# Patient Record
Sex: Female | Born: 1975 | Race: White | Hispanic: No | Marital: Single | Smoking: Smoker, current status unknown
Health system: Southern US, Community
[De-identification: ages and names within clinical notes are randomized; demographics above are authoritative.]

## PROBLEM LIST (undated history)

## (undated) HISTORY — PX: TONSILLECTOMY: SUR1361

---

## 1999-05-31 ENCOUNTER — Other Ambulatory Visit: Admission: RE | Admit: 1999-05-31 | Discharge: 1999-05-31 | Payer: Self-pay | Admitting: Obstetrics & Gynecology

## 1999-10-26 ENCOUNTER — Other Ambulatory Visit: Admission: RE | Admit: 1999-10-26 | Discharge: 1999-10-26 | Payer: Self-pay | Admitting: Obstetrics & Gynecology

## 2000-01-16 ENCOUNTER — Inpatient Hospital Stay (HOSPITAL_COMMUNITY): Admission: AD | Admit: 2000-01-16 | Discharge: 2000-01-18 | Payer: Self-pay | Admitting: Obstetrics & Gynecology

## 2000-02-15 ENCOUNTER — Other Ambulatory Visit: Admission: RE | Admit: 2000-02-15 | Discharge: 2000-02-15 | Payer: Self-pay | Admitting: Obstetrics and Gynecology

## 2000-02-24 ENCOUNTER — Other Ambulatory Visit: Admission: RE | Admit: 2000-02-24 | Discharge: 2000-02-24 | Payer: Self-pay | Admitting: Obstetrics and Gynecology

## 2000-08-03 ENCOUNTER — Other Ambulatory Visit: Admission: RE | Admit: 2000-08-03 | Discharge: 2000-08-03 | Payer: Self-pay | Admitting: Obstetrics and Gynecology

## 2001-08-16 ENCOUNTER — Other Ambulatory Visit: Admission: RE | Admit: 2001-08-16 | Discharge: 2001-08-16 | Payer: Self-pay | Admitting: Obstetrics and Gynecology

## 2003-06-17 ENCOUNTER — Other Ambulatory Visit: Admission: RE | Admit: 2003-06-17 | Discharge: 2003-06-17 | Payer: Self-pay | Admitting: Obstetrics and Gynecology

## 2003-12-05 ENCOUNTER — Inpatient Hospital Stay (HOSPITAL_COMMUNITY): Admission: AD | Admit: 2003-12-05 | Discharge: 2003-12-05 | Payer: Self-pay | Admitting: Obstetrics and Gynecology

## 2003-12-24 ENCOUNTER — Inpatient Hospital Stay (HOSPITAL_COMMUNITY): Admission: AD | Admit: 2003-12-24 | Discharge: 2003-12-26 | Payer: Self-pay | Admitting: Obstetrics and Gynecology

## 2005-06-08 ENCOUNTER — Other Ambulatory Visit: Admission: RE | Admit: 2005-06-08 | Discharge: 2005-06-08 | Payer: Self-pay | Admitting: Obstetrics and Gynecology

## 2005-08-08 IMAGING — US US FETAL BPP W/O NONSTRESS
1 series · 14 of 28 positions shown · non-contrast
Comparison: none

CLINICAL DATA: 28-year-old 42 weeks pregnant with fetal decelerations in the office.

[Series 1: unknown · 0.37mm/px · 14 of 34 slices shown]
[im 2/34]
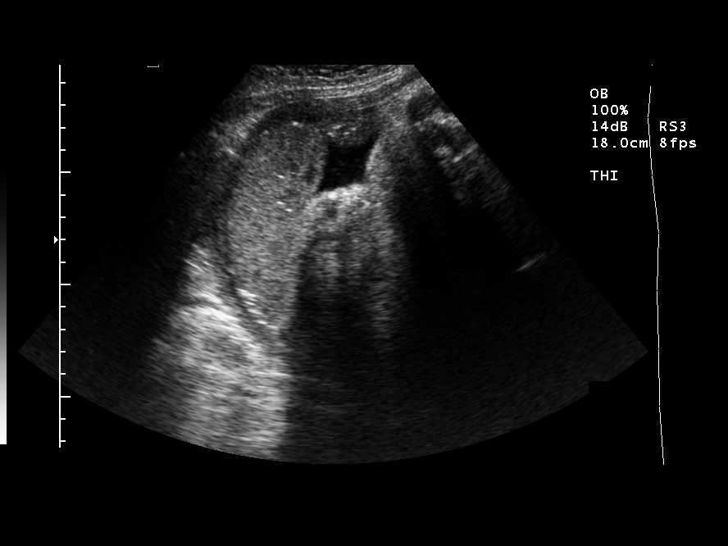
[im 4/34]
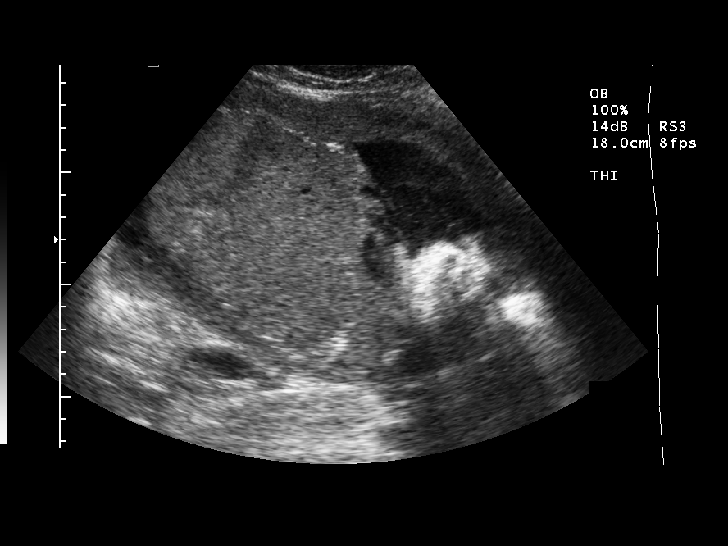
[im 7/34]
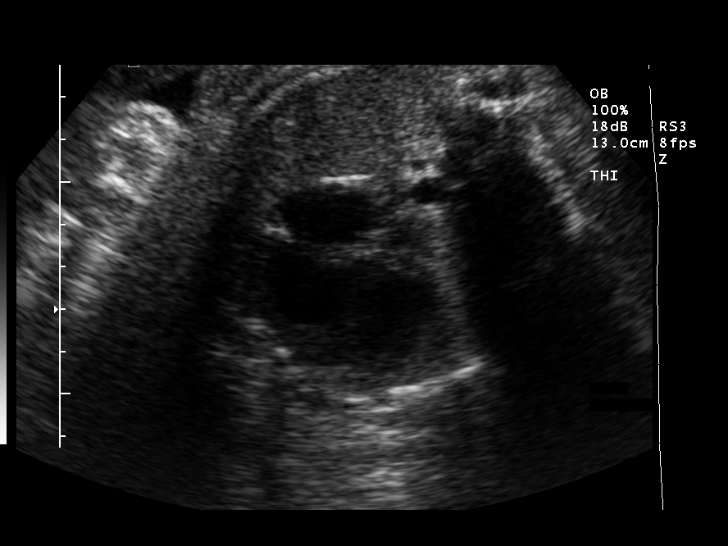
[im 9/34]
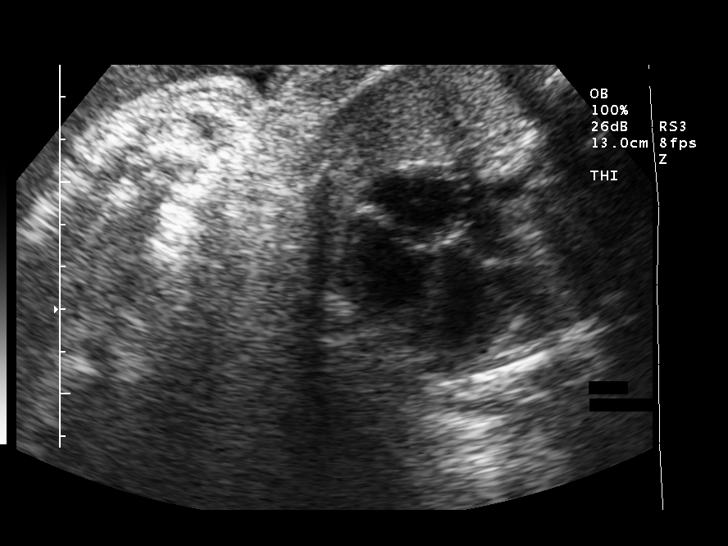
[im 12/34]
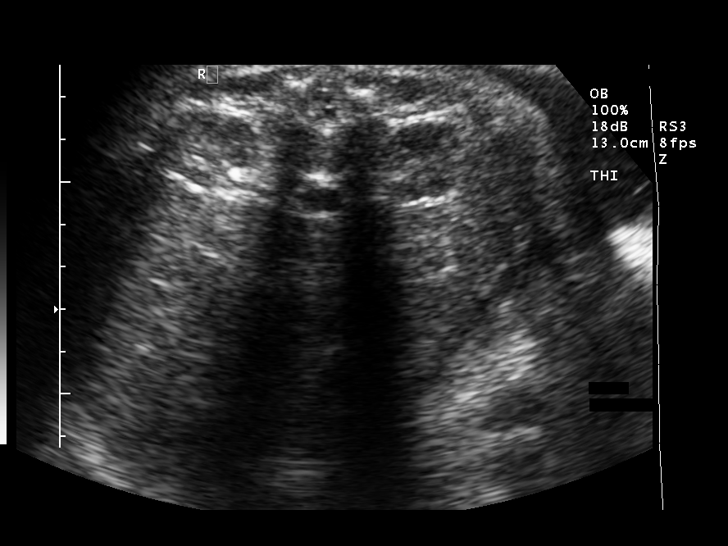
[im 14/34]
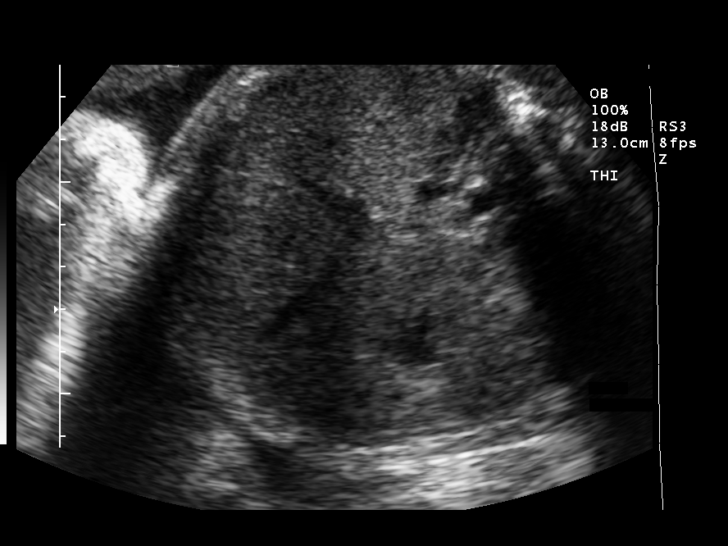
[im 16/34]
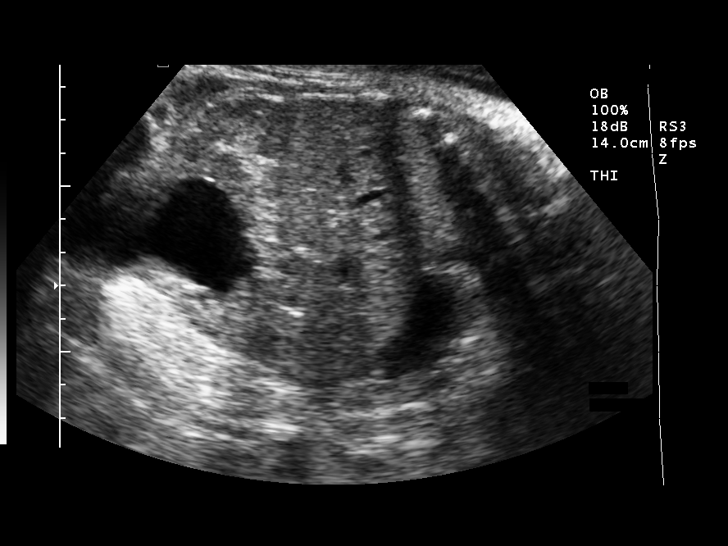
[im 19/34]
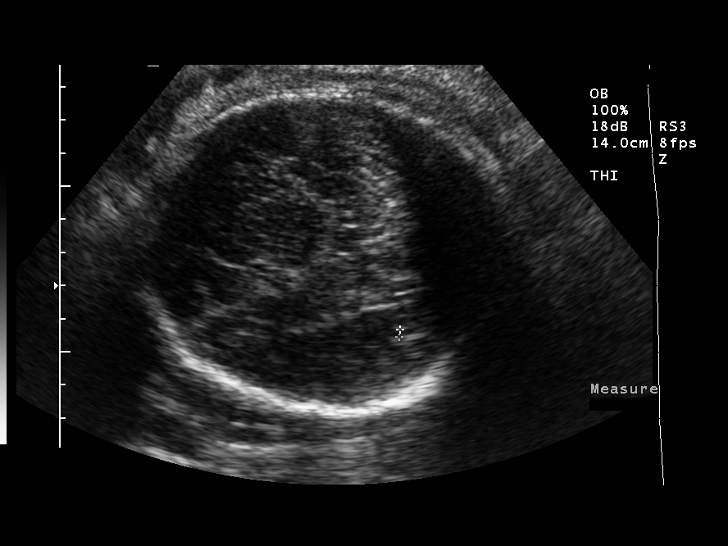
[im 21/34]
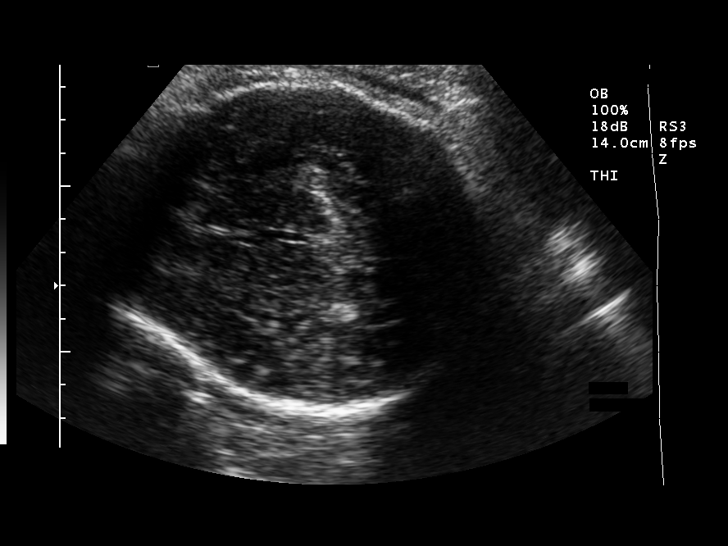
[im 24/34]
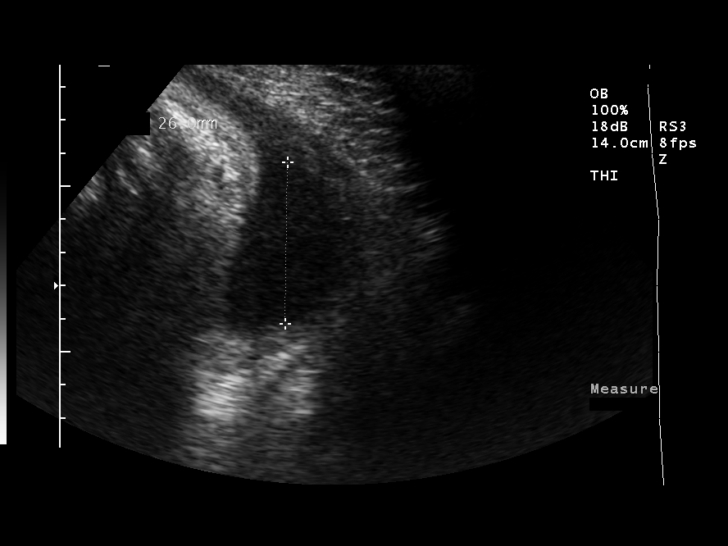
[im 26/34]
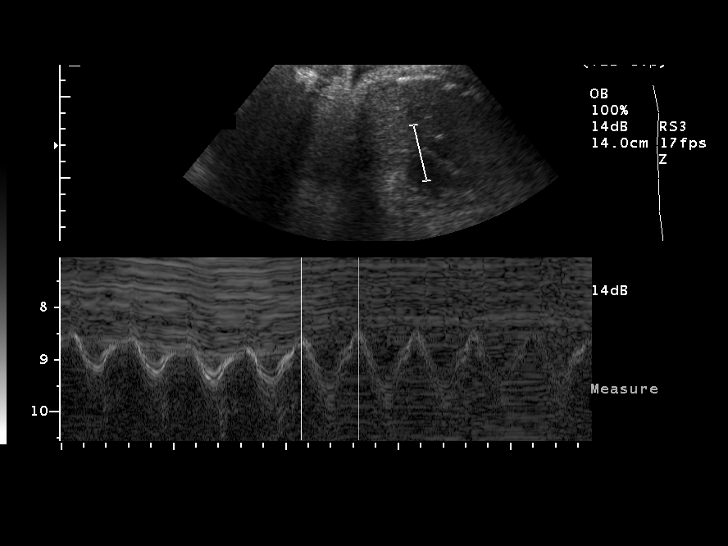
[im 29/34]
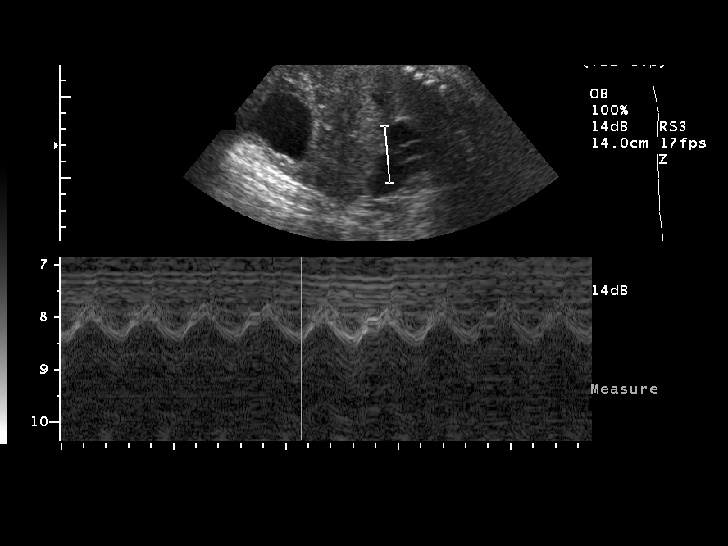
[im 31/34]
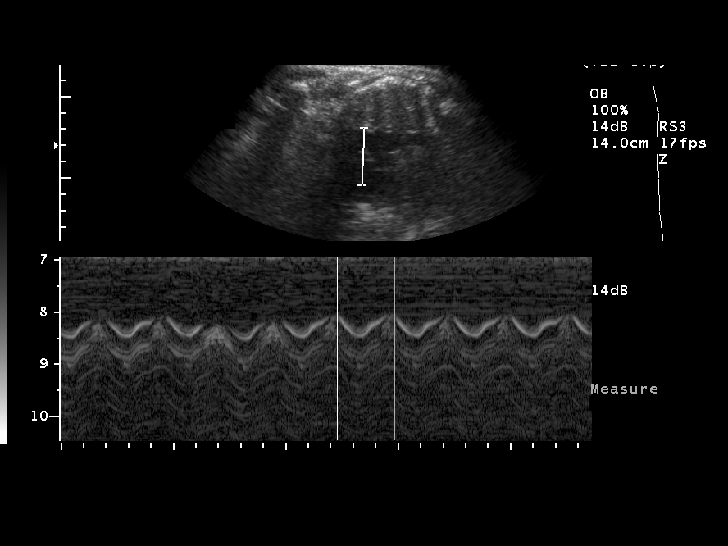
[im 34/34]
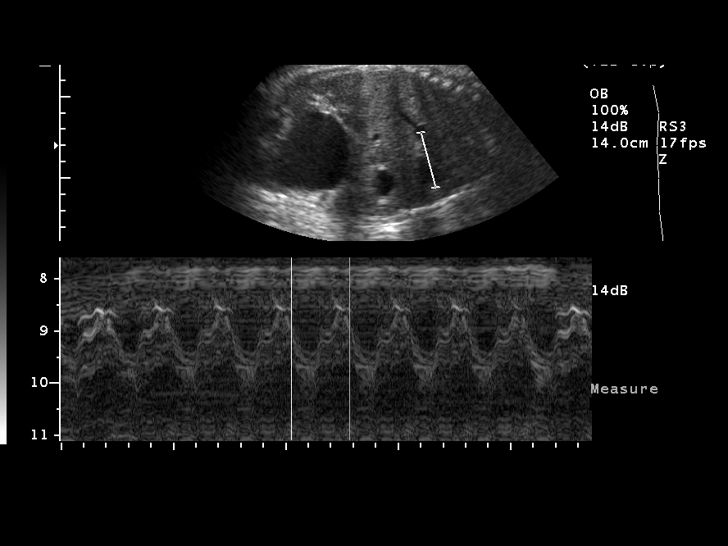

[14 of 28 positions shown; findings below may reference images not displayed]

BIOPHYSICAL PROFILE

 Number of Fetuses:  1
 Heart rate:  120 BPM
 Movement:  Yes
 Breathing:  No
 Presentation:  Cephalic
 Placental Location:  Right lateral
 Grade:  II
 Previa:  No
 Amniotic Fluid (Subjective):  Normal
 Amniotic Fluid (Objective):  AFI 16.2 cm (7th-27th %ile =  7.1 to 21.4 cm for 40 weeks) 

 Fetal measurements and complete anatomic evaluation were not requested.  The following fetal anatomy was visualized on this exam:  Lateral ventricles, thalami, four chamber heart, stomach, three vessel cord, kidneys, bladder, and diaphragm.

 BPP SCORING
 Movements:  2  Time:  30 minutes
 Breathing:  0
 Tone:  2
 Amniotic Fluid:  2
 Total Score:  6

 MATERNAL FINDINGS:
 Cervix:  Not evaluated, >37 weeks.
IMPRESSION: Biophysical profile score [DATE] with no breathing demonstrated.  Heart rate was right around 120 for 90 percent of the examination.

## 2014-07-09 ENCOUNTER — Other Ambulatory Visit: Payer: Self-pay | Admitting: Obstetrics and Gynecology

## 2014-07-09 DIAGNOSIS — R928 Other abnormal and inconclusive findings on diagnostic imaging of breast: Secondary | ICD-10-CM

## 2014-07-18 ENCOUNTER — Ambulatory Visit
Admission: RE | Admit: 2014-07-18 | Discharge: 2014-07-18 | Disposition: A | Discharge status: Home / Self Care | Payer: 59 | Source: Ambulatory Visit | Attending: Obstetrics and Gynecology | Admitting: Obstetrics and Gynecology

## 2014-07-18 ENCOUNTER — Other Ambulatory Visit: Payer: Self-pay | Admitting: Obstetrics and Gynecology

## 2014-07-18 DIAGNOSIS — R928 Other abnormal and inconclusive findings on diagnostic imaging of breast: Secondary | ICD-10-CM

## 2015-02-05 ENCOUNTER — Other Ambulatory Visit: Payer: Self-pay | Admitting: Obstetrics and Gynecology

## 2015-02-05 DIAGNOSIS — N6002 Solitary cyst of left breast: Secondary | ICD-10-CM

## 2015-02-12 ENCOUNTER — Ambulatory Visit
Admission: RE | Admit: 2015-02-12 | Discharge: 2015-02-12 | Disposition: A | Discharge status: Home / Self Care | Payer: 59 | Source: Ambulatory Visit | Attending: Obstetrics and Gynecology | Admitting: Obstetrics and Gynecology

## 2015-02-12 DIAGNOSIS — N6002 Solitary cyst of left breast: Secondary | ICD-10-CM

## 2015-06-12 ENCOUNTER — Other Ambulatory Visit: Payer: Self-pay | Admitting: Obstetrics and Gynecology

## 2015-06-12 DIAGNOSIS — N63 Unspecified lump in unspecified breast: Secondary | ICD-10-CM

## 2020-01-05 ENCOUNTER — Encounter (HOSPITAL_BASED_OUTPATIENT_CLINIC_OR_DEPARTMENT_OTHER): Payer: Self-pay | Admitting: *Deleted

## 2020-01-05 ENCOUNTER — Emergency Department (HOSPITAL_BASED_OUTPATIENT_CLINIC_OR_DEPARTMENT_OTHER)
Admission: EM | Admit: 2020-01-05 | Discharge: 2020-01-05 | Disposition: A | Discharge status: Home / Self Care | Payer: 59 | Attending: Emergency Medicine | Admitting: Emergency Medicine

## 2020-01-05 ENCOUNTER — Other Ambulatory Visit: Payer: Self-pay

## 2020-01-05 ENCOUNTER — Emergency Department (HOSPITAL_BASED_OUTPATIENT_CLINIC_OR_DEPARTMENT_OTHER): Payer: 59

## 2020-01-05 DIAGNOSIS — S0003XA Contusion of scalp, initial encounter: Secondary | ICD-10-CM | POA: Insufficient documentation

## 2020-01-05 DIAGNOSIS — W010XXA Fall on same level from slipping, tripping and stumbling without subsequent striking against object, initial encounter: Secondary | ICD-10-CM | POA: Diagnosis not present

## 2020-01-05 DIAGNOSIS — S0990XA Unspecified injury of head, initial encounter: Secondary | ICD-10-CM | POA: Insufficient documentation

## 2020-01-05 DIAGNOSIS — Y998 Other external cause status: Secondary | ICD-10-CM | POA: Diagnosis not present

## 2020-01-05 DIAGNOSIS — F17200 Nicotine dependence, unspecified, uncomplicated: Secondary | ICD-10-CM | POA: Diagnosis not present

## 2020-01-05 DIAGNOSIS — Y92002 Bathroom of unspecified non-institutional (private) residence single-family (private) house as the place of occurrence of the external cause: Secondary | ICD-10-CM | POA: Diagnosis not present

## 2020-01-05 DIAGNOSIS — Z23 Encounter for immunization: Secondary | ICD-10-CM | POA: Diagnosis not present

## 2020-01-05 DIAGNOSIS — Y9389 Activity, other specified: Secondary | ICD-10-CM | POA: Diagnosis not present

## 2020-01-05 MED ORDER — TETANUS-DIPHTH-ACELL PERTUSSIS 5-2.5-18.5 LF-MCG/0.5 IM SUSP
0.5000 mL | Freq: Once | INTRAMUSCULAR | Status: AC
  Administered 2020-01-05: 0.5 mL via INTRAMUSCULAR
  Filled 2020-01-05: qty 0.5

## 2020-01-05 NOTE — ED Provider Notes (Signed)
Emergency Department Provider Note   I have reviewed the triage vital signs and the nursing notes.   HISTORY  Chief Complaint fall   HPI Pam Elliott is a 44 y.o. female presents to the emergency department for evaluation after mechanical fall in her bathroom.  Patient denies loss of consciousness states she is feeling a little bit lightheaded.  She has been drinking this evening.  Patient states she was in her bathroom when she lost her footing.  She fell backwards and believes struck the back of her head on either the toilet or the molding.  She noticed bleeding from the back of the head.  No numbness or weakness.  No neck or back pain.  Pain is mild to moderate and persistent.    History reviewed. No pertinent past medical history.  There are no problems to display for this patient.   Past Surgical History:  Procedure Laterality Date  . TONSILLECTOMY      Allergies Penicillins  No family history on file.  Social History Social History   Tobacco Use  . Smoking status: Smoker, Current Status Unknown  . Smokeless tobacco: Never Used  Substance Use Topics  . Alcohol use: Yes  . Drug use: Never    Review of Systems  Constitutional: No fever/chills Eyes: No visual changes. ENT: No sore throat. Cardiovascular: Denies chest pain. Respiratory: Denies shortness of breath. Gastrointestinal: No abdominal pain.  No nausea, no vomiting.  No diarrhea.  No constipation. Genitourinary: Negative for dysuria. Musculoskeletal: Negative for back pain. Skin: Bleeding from scalp.  Neurological: Negative for focal weakness or numbness. Positive HA.   10-point ROS otherwise negative.  ____________________________________________   PHYSICAL EXAM:  VITAL SIGNS: ED Triage Vitals  Enc Vitals Group     BP 01/05/20 0459 131/83     Pulse Rate 01/05/20 0459 90     Resp 01/05/20 0459 18     Temp 01/05/20 0459 98.1 F (36.7 C)     Temp src --      SpO2 01/05/20 0459 99 %      Weight 01/05/20 0455 140 lb (63.5 kg)     Height 01/05/20 0455 5\' 3"  (1.6 m)   Constitutional: Alert and oriented. Well appearing and in no acute distress. Eyes: Conjunctivae are normal. PERRL. Head: Abrasion to the crown of the scalp. No laceration apparent. Wound is hemostatic.  Nose: No congestion/rhinnorhea. Mouth/Throat: Mucous membranes are moist.   Neck: No stridor.  No cervical spine tenderness to palpation. Cardiovascular: Normal rate, regular rhythm. Good peripheral circulation. Grossly normal heart sounds.   Respiratory: Normal respiratory effort.  No retractions. Lungs CTAB. Gastrointestinal: No distention.  Musculoskeletal: No gross deformities of extremities. Neurologic:  Normal speech and language. No gross focal neurologic deficits are appreciated.  Skin:  Skin is warm, dry and intact. No rash noted.  ____________________________________________  RADIOLOGY  CT Head Wo Contrast  Result Date: 01/05/2020 CLINICAL DATA:  Initial evaluation for acute trauma, fall. EXAM: CT HEAD WITHOUT CONTRAST TECHNIQUE: Contiguous axial images were obtained from the base of the skull through the vertex without intravenous contrast. COMPARISON:  None. FINDINGS: Brain: Cerebral volume within normal limits for patient age. No evidence for acute intracranial hemorrhage. No findings to suggest acute large vessel territory infarct. No mass lesion, midline shift, or mass effect. Ventricles are normal in size without evidence for hydrocephalus. No extra-axial fluid collection identified. Vascular: No hyperdense vessel identified. Skull: Soft tissue contusion present at the right parietal scalp. Calvarium intact. Sinuses/Orbits:  Globes and orbital soft tissues within normal limits. Visualized paranasal sinuses are clear. No mastoid effusion. IMPRESSION: 1. No acute intracranial abnormality. 2. Soft tissue contusion at the right parietal scalp. No calvarial fracture. Electronically Signed   By: Rise Mu M.D.   On: 01/05/2020 05:53    ____________________________________________   PROCEDURES  Procedure(s) performed:   Procedures  None  ____________________________________________   INITIAL IMPRESSION / ASSESSMENT AND PLAN / ED COURSE  Pertinent labs & imaging results that were available during my care of the patient were reviewed by me and considered in my medical decision making (see chart for details).   Patient presents emergency department evaluation after mechanical fall.  She has an abrasion to the crown of the head with no laceration requiring repair.  This area was cleaned.  Tetanus updated.  CT imaging reviewed with no acute findings.  Plan for discharge with close PCP follow-up to screen for post-concussion symptoms.    ____________________________________________  FINAL CLINICAL IMPRESSION(S) / ED DIAGNOSES  Final diagnoses:  Injury of head, initial encounter  Contusion of scalp, initial encounter     MEDICATIONS GIVEN DURING THIS VISIT:  Medications  Tdap (BOOSTRIX) injection 0.5 mL (0.5 mLs Intramuscular Given 01/05/20 0520)     Note:  This document was prepared using Dragon voice recognition software and may include unintentional dictation errors.  Alona Bene, MD, [facility]    Azam Gervasi, Arlyss Repress, MD 01/07/20 [id number]

## 2020-01-05 NOTE — ED Triage Notes (Signed)
Pt states she tripped and fell in her bathroom hitting her head. Denies loc. C/o feeling a little dizzy. Laceration noted to the posterior aspect of her head. States she has had 4 beers from midnight until 3am.

## 2020-01-05 NOTE — Discharge Instructions (Signed)
You were seen in the emergency room today after fall and head injury.  Your CT scan did not show any bleeding or fracture.  You have a contusion to your scalp and bleeding seems well controlled.  This did not require suture or staple.  You may develop concussion symptoms after this.  If you do develop the symptoms you should discuss them further with your primary care doctor as sometimes these require referral to neurology.  Return to the emergency department any new or suddenly worsening symptoms.

## 2020-01-05 NOTE — ED Notes (Signed)
Pt to CT

## 2020-11-30 ENCOUNTER — Other Ambulatory Visit: Payer: Self-pay | Admitting: Obstetrics

## 2020-12-17 ENCOUNTER — Other Ambulatory Visit: Payer: Self-pay | Admitting: Obstetrics

## 2020-12-17 DIAGNOSIS — R928 Other abnormal and inconclusive findings on diagnostic imaging of breast: Secondary | ICD-10-CM

## 2021-01-04 ENCOUNTER — Ambulatory Visit: Payer: 59

## 2021-01-04 ENCOUNTER — Ambulatory Visit
Admission: RE | Admit: 2021-01-04 | Discharge: 2021-01-04 | Disposition: A | Discharge status: Home / Self Care | Payer: 59 | Source: Ambulatory Visit | Attending: Obstetrics | Admitting: Obstetrics

## 2021-01-04 ENCOUNTER — Other Ambulatory Visit: Payer: Self-pay

## 2021-01-04 DIAGNOSIS — R928 Other abnormal and inconclusive findings on diagnostic imaging of breast: Secondary | ICD-10-CM
